# Patient Record
Sex: Female | Born: 2000 | Race: White | Hispanic: No | Marital: Single | Smoking: Never smoker
Health system: Southern US, Community
[De-identification: ages and names within clinical notes are randomized; demographics above are authoritative.]

## PROBLEM LIST (undated history)

## (undated) ENCOUNTER — Ambulatory Visit (HOSPITAL_COMMUNITY): Admission: EM | Payer: Medicaid Other | Source: Home / Self Care

## (undated) DIAGNOSIS — E039 Hypothyroidism, unspecified: Secondary | ICD-10-CM

---
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
## 2022-01-30 DIAGNOSIS — N926 Irregular menstruation, unspecified: Secondary | ICD-10-CM | POA: Diagnosis not present

## 2022-01-30 DIAGNOSIS — E039 Hypothyroidism, unspecified: Secondary | ICD-10-CM | POA: Diagnosis not present

## 2022-01-30 DIAGNOSIS — F901 Attention-deficit hyperactivity disorder, predominantly hyperactive type: Secondary | ICD-10-CM | POA: Diagnosis not present

## 2022-01-30 DIAGNOSIS — H6501 Acute serous otitis media, right ear: Secondary | ICD-10-CM | POA: Diagnosis not present

## 2022-02-09 ENCOUNTER — Encounter: Payer: Medicaid Other | Admitting: Obstetrics and Gynecology

## 2022-04-09 ENCOUNTER — Encounter: Payer: Medicaid Other | Admitting: Certified Nurse Midwife

## 2022-04-09 ENCOUNTER — Emergency Department (HOSPITAL_COMMUNITY)
Admission: EM | Admit: 2022-04-09 | Discharge: 2022-04-09 | Disposition: A | Discharge status: Home / Self Care | Payer: Medicaid Other

## 2022-04-09 DIAGNOSIS — U071 COVID-19: Secondary | ICD-10-CM | POA: Diagnosis not present

## 2022-04-09 DIAGNOSIS — Z01419 Encounter for gynecological examination (general) (routine) without abnormal findings: Secondary | ICD-10-CM

## 2022-04-09 DIAGNOSIS — Z3009 Encounter for other general counseling and advice on contraception: Secondary | ICD-10-CM

## 2022-04-09 DIAGNOSIS — R509 Fever, unspecified: Secondary | ICD-10-CM | POA: Diagnosis not present

## 2022-04-09 DIAGNOSIS — N939 Abnormal uterine and vaginal bleeding, unspecified: Secondary | ICD-10-CM

## 2022-04-09 NOTE — ED Notes (Signed)
Called multiple times for triage and unable to find 

## 2022-05-09 DIAGNOSIS — R42 Dizziness and giddiness: Secondary | ICD-10-CM | POA: Diagnosis not present

## 2022-05-09 DIAGNOSIS — G4489 Other headache syndrome: Secondary | ICD-10-CM | POA: Diagnosis not present

## 2022-05-09 DIAGNOSIS — H9203 Otalgia, bilateral: Secondary | ICD-10-CM | POA: Diagnosis not present

## 2022-05-09 DIAGNOSIS — H6993 Unspecified Eustachian tube disorder, bilateral: Secondary | ICD-10-CM | POA: Diagnosis not present

## 2022-06-18 ENCOUNTER — Other Ambulatory Visit (HOSPITAL_COMMUNITY)
Admission: RE | Admit: 2022-06-18 | Discharge: 2022-06-18 | Disposition: A | Discharge status: Home / Self Care | Payer: Medicaid Other | Source: Ambulatory Visit

## 2022-06-18 ENCOUNTER — Ambulatory Visit (INDEPENDENT_AMBULATORY_CARE_PROVIDER_SITE_OTHER): Payer: Medicaid Other | Admitting: Student

## 2022-06-18 ENCOUNTER — Encounter: Payer: Self-pay | Admitting: Student

## 2022-06-18 VITALS — BP 108/62 | HR 87 | Ht 64.0 in | Wt 212.0 lb

## 2022-06-18 DIAGNOSIS — Z30011 Encounter for initial prescription of contraceptive pills: Secondary | ICD-10-CM

## 2022-06-18 DIAGNOSIS — Z7689 Persons encountering health services in other specified circumstances: Secondary | ICD-10-CM

## 2022-06-18 DIAGNOSIS — Z113 Encounter for screening for infections with a predominantly sexual mode of transmission: Secondary | ICD-10-CM

## 2022-06-18 DIAGNOSIS — N920 Excessive and frequent menstruation with regular cycle: Secondary | ICD-10-CM | POA: Diagnosis not present

## 2022-06-18 DIAGNOSIS — N922 Excessive menstruation at puberty: Secondary | ICD-10-CM

## 2022-06-18 DIAGNOSIS — Z01419 Encounter for gynecological examination (general) (routine) without abnormal findings: Secondary | ICD-10-CM | POA: Diagnosis not present

## 2022-06-18 DIAGNOSIS — N926 Irregular menstruation, unspecified: Secondary | ICD-10-CM | POA: Diagnosis not present

## 2022-06-18 LAB — POCT URINE PREGNANCY: Preg Test, Ur: NEGATIVE

## 2022-06-18 MED ORDER — NORETHIN ACE-ETH ESTRAD-FE 1-20 MG-MCG PO TABS
1.0000 | ORAL_TABLET | Freq: Every day | ORAL | 4 refills | Status: AC
Start: 2022-06-18 — End: 2023-06-18

## 2022-06-18 NOTE — Progress Notes (Signed)
History:  [name] is a 21 y.o. [id number] who presents to clinic today for concerns with menstrual cycles.   Patient reports heavy and uncomfortable periods since onset at 21 years old. Periods are irregular. Patient states they generally come every 6 weeks, however, sometimes it will come sooner at 2 weeks. Periods are reported as heavy for 5 days and then light spotting for the last 3 days. Patient is using regular pads or super tampons that are saturating every 2 hours. During her periods she experiences painful cramping and loose stools. Also has noted that she experiences episodes of sexual intercourse that cause her to feel faint and unwell. Shares this does not happen every time. Patient is currently taking synthroid for hypothyroidism and has not been able to obtain follow-up blood work in 1.5 years (has been waiting for endocrinologist to become available). Patient also discloses starting a COC at this same time, but discontinuing after 2 months due to no improvement in symptoms. Patient desires STI testing today for routine surveillance.   The following portions of the patient's history were reviewed and updated as appropriate: allergies, current medications, family history, past medical history, social history, past surgical history and problem list.  Review of Systems:  Review of Systems  All other systems reviewed and are negative.     Objective:  Physical Exam BP 108/62   Pulse 87   Ht 5\' 4"  (1.626 m)   Wt 212 lb (96.2 kg)   LMP 06/15/2022   BMI 36.39 kg/m  Physical Exam Vitals and nursing note reviewed.  Constitutional:      Appearance: Normal appearance.  HENT:     Head: Normocephalic.  Cardiovascular:     Rate and Rhythm: Normal rate.  Pulmonary:     Effort: Pulmonary effort is normal.  Abdominal:     Palpations: Abdomen is soft.     Tenderness: There is no abdominal tenderness.  Skin:    General: Skin is warm and dry.  Neurological:     Mental Status:  She is alert and oriented to person, place, and time.    Labs and Imaging No results found for this or any previous visit (from the past 24 hour(s)).  No results found.  Health Maintenance Due  Topic Date Due   COVID-19 Vaccine (1) Never done   HPV VACCINES (1 - 2-dose series) Never done   CHLAMYDIA SCREENING  Never done   HIV Screening  Never done   Hepatitis C Screening  Never done   INFLUENZA VACCINE  Never done   DTaP/Tdap/Td (1 - Tdap) Never done    Labs, imaging and previous visits in Epic and Care Everywhere reviewed  Assessment & Plan:  1. Routine screening for STI (sexually transmitted infection) - No current s/s - Cervicovaginal ancillary only - Hepatitis B surface antigen - Hepatitis C antibody - HIV Antibody (routine testing w rflx) - RPR  2. Encounter to establish care - Addressed patient desire to see Gynecologist for women's health concerns  3. Excessive menstruation at puberty 4. Irregular periods/menstrual cycles - POCT Pregnancy, Urine - Thyroid Panel With TSH - CBC - Prolactin - HgB A1c - FSH - Testosterone, Free, Total, SHBG  5. Encounter for BCP (birth control pills) initial prescription - Negative pregnancy test today. - Within 7 days of previous period, encouraged to Quick start. Discussed the benefits of OCPs in regulating cycles. Reviewed other forms of combined  hormonal contraceptive medication including patch and ring. Risks and benefits reviewed.  Questions were answered.  Information was given to patient to review.    Johnston Ebbs, NP 06/18/2022 2:00 PM

## 2022-06-18 NOTE — Progress Notes (Signed)
NGYN presents for menorrhagia, dysmenorrhea, and all STD testing.  She believes BCP made menstrual cramping worst.

## 2022-06-19 LAB — CERVICOVAGINAL ANCILLARY ONLY
Chlamydia: NEGATIVE
Comment: NEGATIVE
Comment: NEGATIVE
Comment: NORMAL
Neisseria Gonorrhea: NEGATIVE
Trichomonas: NEGATIVE

## 2022-06-19 LAB — HEPATITIS B SURFACE ANTIGEN: Hepatitis B Surface Ag: NEGATIVE

## 2022-06-19 LAB — RPR: RPR Ser Ql: NONREACTIVE

## 2022-06-19 LAB — HIV ANTIBODY (ROUTINE TESTING W REFLEX): HIV Screen 4th Generation wRfx: NONREACTIVE

## 2022-06-19 LAB — HEPATITIS C ANTIBODY: Hep C Virus Ab: NONREACTIVE

## 2022-06-22 LAB — TESTOSTERONE, FREE, TOTAL, SHBG
Sex Hormone Binding: 24 nmol/L — ABNORMAL LOW (ref 24.6–122.0)
Testosterone, Free: 6.3 pg/mL
Testosterone: 80 ng/dL — ABNORMAL HIGH (ref 13–71)

## 2022-06-22 LAB — CBC
Hematocrit: 40.6 % (ref 34.0–46.6)
Hemoglobin: 14 g/dL (ref 11.1–15.9)
MCH: 31.6 pg (ref 26.6–33.0)
MCHC: 34.5 g/dL (ref 31.5–35.7)
MCV: 92 fL (ref 79–97)
Platelets: 311 10*3/uL (ref 150–450)
RBC: 4.43 x10E6/uL (ref 3.77–5.28)
RDW: 12.4 % (ref 11.7–15.4)
WBC: 6.3 10*3/uL (ref 3.4–10.8)

## 2022-06-22 LAB — FOLLICLE STIMULATING HORMONE: FSH: 5.5 m[IU]/mL

## 2022-06-22 LAB — THYROID PANEL WITH TSH
Free Thyroxine Index: 1.5 (ref 1.2–4.9)
T3 Uptake Ratio: 26 % (ref 24–39)
T4, Total: 5.9 ug/dL (ref 4.5–12.0)
TSH: 6.44 u[IU]/mL — ABNORMAL HIGH (ref 0.450–4.500)

## 2022-06-22 LAB — HEMOGLOBIN A1C
Est. average glucose Bld gHb Est-mCnc: 103 mg/dL
Hgb A1c MFr Bld: 5.2 % (ref 4.8–5.6)

## 2022-06-22 LAB — PROLACTIN: Prolactin: 6.3 ng/mL (ref 4.8–23.3)

## 2022-06-28 ENCOUNTER — Other Ambulatory Visit: Payer: Self-pay | Admitting: Student

## 2022-06-28 DIAGNOSIS — E039 Hypothyroidism, unspecified: Secondary | ICD-10-CM

## 2022-06-28 MED ORDER — LEVOTHYROXINE SODIUM 125 MCG PO TABS
125.0000 ug | ORAL_TABLET | Freq: Every day | ORAL | 0 refills | Status: DC
  Filled 2022-06-28: qty 60, 60d supply, fill #0

## 2022-06-29 ENCOUNTER — Other Ambulatory Visit (HOSPITAL_COMMUNITY): Payer: Self-pay

## 2022-07-17 DIAGNOSIS — R059 Cough, unspecified: Secondary | ICD-10-CM | POA: Diagnosis not present

## 2022-07-17 DIAGNOSIS — Z20822 Contact with and (suspected) exposure to covid-19: Secondary | ICD-10-CM | POA: Diagnosis not present

## 2022-07-17 DIAGNOSIS — R079 Chest pain, unspecified: Secondary | ICD-10-CM | POA: Diagnosis not present

## 2022-08-24 ENCOUNTER — Encounter: Payer: Self-pay | Admitting: Internal Medicine

## 2022-08-24 ENCOUNTER — Ambulatory Visit (INDEPENDENT_AMBULATORY_CARE_PROVIDER_SITE_OTHER): Payer: Medicaid Other | Admitting: Internal Medicine

## 2022-08-24 VITALS — BP 120/80 | HR 76 | Ht 64.0 in | Wt 209.0 lb

## 2022-08-24 DIAGNOSIS — E039 Hypothyroidism, unspecified: Secondary | ICD-10-CM | POA: Diagnosis not present

## 2022-08-24 DIAGNOSIS — R635 Abnormal weight gain: Secondary | ICD-10-CM

## 2022-08-24 DIAGNOSIS — R7989 Other specified abnormal findings of blood chemistry: Secondary | ICD-10-CM | POA: Diagnosis not present

## 2022-08-24 DIAGNOSIS — R5383 Other fatigue: Secondary | ICD-10-CM | POA: Diagnosis not present

## 2022-08-24 DIAGNOSIS — E559 Vitamin D deficiency, unspecified: Secondary | ICD-10-CM

## 2022-08-24 LAB — VITAMIN D 25 HYDROXY (VIT D DEFICIENCY, FRACTURES): VITD: 16.43 ng/mL — ABNORMAL LOW (ref 30.00–100.00)

## 2022-08-24 LAB — BASIC METABOLIC PANEL
BUN: 10 mg/dL (ref 6–23)
CO2: 23 mEq/L (ref 19–32)
Calcium: 9.9 mg/dL (ref 8.4–10.5)
Chloride: 103 mEq/L (ref 96–112)
Creatinine, Ser: 0.82 mg/dL (ref 0.40–1.20)
GFR: 103.81 mL/min (ref >60.00)
Glucose, Bld: 94 mg/dL (ref 70–99)
Potassium: 4.1 mEq/L (ref 3.5–5.1)
Sodium: 136 mEq/L (ref 135–145)

## 2022-08-24 LAB — TSH: TSH: 12.09 u[IU]/mL — ABNORMAL HIGH (ref 0.40–5.00)

## 2022-08-24 LAB — VITAMIN B12: Vitamin B-12: 223 pg/mL (ref 211–911)

## 2022-08-24 NOTE — Patient Instructions (Signed)

## 2022-08-24 NOTE — Progress Notes (Signed)
Name: Michele Grant  MRN/ DOB: [id number], August 15, 2000    Age/ Sex: 22 y.o., female    PCP: Aggie Hacker, MD   Reason for Endocrinology Evaluation: Hypothyroidism      Date of Initial Endocrinology Evaluation: 08/24/2022     HPI: [name] is a 22 y.o. female with a past medical history of Hypothyroidism. The patient presented for initial endocrinology clinic visit on 08/24/2022 for consultative assistance with her Hypothyroidism.    She is accompanied by her boyfriend today Pt has been diagnosed with hypothyroidism 2023, she was started on Levothyroxine at the time  She was having weight gain as well as hair loss and fatigue , she has not noted any improvement of her symptoms despite being on levothyroxine  She does endorse occasional local neck swelling LMP 08/23/2022- monthly ` Follows with Gyn   Has noted hirsutism around the chin area  Denies acne   She is not on a certain diet , she does not exercise per se but has an active life She has also been noted with elevated testosterone 06/2022 at 80 ng/dL  She had a normal pelvic ultrasound 02/2021 Tried OCP's but caused amenorrhea   Maternal GM with thyroid disease  HISTORY:  Past Medical History: No past medical history on file. Past Surgical History: No past surgical history on file.  Social History:  reports that she has never smoked. She has been exposed to tobacco smoke. She has never used smokeless tobacco. She reports that she does not drink alcohol and does not use drugs. Family History: family history is not on file.   HOME MEDICATIONS: Allergies as of 08/24/2022   No Known Allergies      Medication List        Accurate as of August 24, 2022  1:12 PM. If you have any questions, ask your nurse or doctor.          STOP taking these medications    meclizine 25 MG tablet Commonly known as: ANTIVERT Stopped by: Scarlette Shorts, MD   ondansetron 4 MG tablet Commonly known as: Zofran Stopped  by: Scarlette Shorts, MD       TAKE these medications    levothyroxine 125 MCG tablet Commonly known as: Synthroid Take 1 tablet (125 mcg total) by mouth daily before breakfast.   levothyroxine 112 MCG tablet Commonly known as: SYNTHROID Take 112 mcg by mouth daily.   norethindrone-ethinyl estradiol-FE 1-20 MG-MCG tablet Commonly known as: LOESTRIN FE Take 1 tablet by mouth daily.   Vyvanse 30 MG capsule Generic drug: lisdexamfetamine Take 30 mg by mouth daily.          REVIEW OF SYSTEMS: A comprehensive ROS was conducted with the patient and is negative except as per HPI     OBJECTIVE:  VS: BP 120/80 (BP Location: Left Arm, Patient Position: Sitting, Cuff Size: Large)   Pulse 76   Ht 5\' 4"  (1.626 m)   Wt 209 lb (94.8 kg)   SpO2 99%   BMI 35.87 kg/m    Wt Readings from Last 3 Encounters:  08/24/22 209 lb (94.8 kg) (98 %, Z= 2.07)*  06/18/22 212 lb (96.2 kg) (98 %, Z= 2.11)*  08/06/21 200 lb (90.7 kg) (98 %, Z= 1.97)*   * Growth percentiles are based on CDC (Girls, 2-20 Years) data.     EXAM: General: Pt appears well and is in NAD  Eyes: External eye exam normal without stare, lid lag or exophthalmos.  EOM intact.  Neck: General: Supple without adenopathy. Thyroid: Thyroid size normal.  No goiter or nodules appreciated.  Lungs: Clear with good BS bilat with no rales, rhonchi, or wheezes  Heart: Auscultation: RRR.  Abdomen: soft, nontender, multiple abdominal wall striae  Extremities:  BL LE: No pretibial edema normal ROM and strength.  Mental Status: Judgment, insight: Intact Orientation: Oriented to time, place, and person Mood and affect: No depression, anxiety, or agitation     DATA REVIEWED:     Latest Reference Range & Units 08/24/22 13:52  Sodium 135 - 145 mEq/L 136  Potassium 3.5 - 5.1 mEq/L 4.1  Chloride 96 - 112 mEq/L 103  CO2 19 - 32 mEq/L 23  Glucose 70 - 99 mg/dL 94  BUN 6 - 23 mg/dL 10  Creatinine 1.61 - 0.96 mg/dL 0.45   Calcium 8.4 - 40.9 mg/dL 9.9  GFR >81.19 mL/min 103.81    Latest Reference Range & Units 08/24/22 13:52  VITD 30.00 - 100.00 ng/mL 16.43 (L)  Vitamin B12 211 - 911 pg/mL 223    Latest Reference Range & Units 08/24/22 13:52  TSH 0.40 - 5.00 uIU/mL 12.09 (H)  (H): Data is abnormally high  ASSESSMENT/PLAN/RECOMMENDATIONS:   Hypothyroidism:  -Patient with symptoms of weight gain, and fatigue -- Pt educated extensively on the correct way to take levothyroxine (first thing in the morning with water, 30 minutes before eating or taking other medications). - Pt encouraged to double dose the following day if she were to miss a dose given long half-life of levothyroxine.   Medications : Levothyroxine 112 mcg daily   2.  Elevated testosterone:  -Patient does NOT meet criteria for PCOS -She does have minimal hirsutism around the chin area, she tried OCPs through her GYN but did not like the way they made her feel -We discussed lifestyle changes and weight loss as a way to improve testosterone level   3.  Weight gain:  -Will screen for Cushing syndrome with 24-hour urinary cortisol -Counseled patient regarding lifestyle changes, avoiding snacks, avoiding sugar sweetened Beverages -Encourage regular exercise as tolerated  4.  Vitamin D deficiency/low vitamin B12:  -Will start ergocalciferol 50,000 IU weekly -Patient will be advised to start vitamin B12 1000 mcg daily    Follow-up in 6 months  Signed electronically by: Lyndle Herrlich, MD  [facility]  [facility] [address] [place] [place], [place] [address] Phone: [phone] FAX: [phone]   CC: Aggie Hacker, MD [address] [place] [address] Phone: [phone] Fax: [phone]   Return to Endocrinology clinic as below: No future appointments.

## 2022-08-25 DIAGNOSIS — E559 Vitamin D deficiency, unspecified: Secondary | ICD-10-CM | POA: Insufficient documentation

## 2022-08-25 DIAGNOSIS — R635 Abnormal weight gain: Secondary | ICD-10-CM | POA: Insufficient documentation

## 2022-08-25 DIAGNOSIS — R7989 Other specified abnormal findings of blood chemistry: Secondary | ICD-10-CM | POA: Insufficient documentation

## 2022-08-25 DIAGNOSIS — R5383 Other fatigue: Secondary | ICD-10-CM | POA: Insufficient documentation

## 2022-08-25 DIAGNOSIS — E039 Hypothyroidism, unspecified: Secondary | ICD-10-CM | POA: Insufficient documentation

## 2022-08-25 MED ORDER — VITAMIN D (ERGOCALCIFEROL) 1.25 MG (50000 UNIT) PO CAPS: 50000.0000 [IU] | ORAL_CAPSULE | ORAL | 2 refills | Status: AC

## 2022-08-25 MED ORDER — LEVOTHYROXINE SODIUM 125 MCG PO TABS: 125.0000 ug | ORAL_TABLET | Freq: Every day | ORAL | 2 refills | Status: DC

## 2022-08-26 LAB — THYROID PEROXIDASE ANTIBODY: Thyroperoxidase Ab SerPl-aCnc: 410 IU/mL — ABNORMAL HIGH (ref <9)

## 2022-09-14 DIAGNOSIS — F4310 Post-traumatic stress disorder, unspecified: Secondary | ICD-10-CM | POA: Diagnosis not present

## 2022-10-06 DIAGNOSIS — H5213 Myopia, bilateral: Secondary | ICD-10-CM | POA: Diagnosis not present

## 2022-10-20 ENCOUNTER — Other Ambulatory Visit (INDEPENDENT_AMBULATORY_CARE_PROVIDER_SITE_OTHER): Payer: Medicaid Other

## 2022-10-20 DIAGNOSIS — E039 Hypothyroidism, unspecified: Secondary | ICD-10-CM | POA: Diagnosis not present

## 2022-10-20 LAB — TSH: TSH: 3.17 u[IU]/mL (ref 0.40–5.00)

## 2022-10-22 DIAGNOSIS — H6993 Unspecified Eustachian tube disorder, bilateral: Secondary | ICD-10-CM | POA: Diagnosis not present

## 2022-10-22 DIAGNOSIS — H938X3 Other specified disorders of ear, bilateral: Secondary | ICD-10-CM | POA: Diagnosis not present

## 2022-11-23 DIAGNOSIS — H5213 Myopia, bilateral: Secondary | ICD-10-CM | POA: Diagnosis not present

## 2022-11-23 DIAGNOSIS — H52223 Regular astigmatism, bilateral: Secondary | ICD-10-CM | POA: Diagnosis not present

## 2022-11-27 ENCOUNTER — Ambulatory Visit: Payer: Medicaid Other | Admitting: Student

## 2023-01-27 ENCOUNTER — Emergency Department (HOSPITAL_COMMUNITY)
Admission: EM | Admit: 2023-01-27 | Discharge: 2023-01-27 | Disposition: A | Discharge status: Home / Self Care | Payer: Medicaid Other | Attending: Emergency Medicine | Admitting: Emergency Medicine

## 2023-01-27 ENCOUNTER — Encounter (HOSPITAL_COMMUNITY): Payer: Self-pay | Admitting: Emergency Medicine

## 2023-01-27 ENCOUNTER — Other Ambulatory Visit: Payer: Self-pay

## 2023-01-27 DIAGNOSIS — R519 Headache, unspecified: Secondary | ICD-10-CM | POA: Insufficient documentation

## 2023-01-27 HISTORY — DX: Hypothyroidism, unspecified: E03.9

## 2023-01-27 MED ORDER — KETOROLAC TROMETHAMINE 60 MG/2ML IM SOLN
30.0000 mg | Freq: Once | INTRAMUSCULAR | Status: AC
  Administered 2023-01-27: 30 mg via INTRAMUSCULAR
  Filled 2023-01-27: qty 2

## 2023-01-27 NOTE — ED Triage Notes (Signed)
Pt reports waking up with headache to back of head. No relief with tylenol. Hx of migraines

## 2023-01-27 NOTE — ED Provider Notes (Signed)
[place] EMERGENCY DEPARTMENT AT [facility] Provider Note   CSN: [id number] Arrival date & time: 01/27/23  4098     History  Chief Complaint  Patient presents with   Headache    Michele Grant is a 22 y.o. female.  22 year old female with history of migraines presents with pain to her but similar to her prior migraines.  Denies any vomiting has some nausea.  No visual changes.  No peripheral weakness.  Took Tylenol with some relief.  Denies any fever or neck pain       Home Medications Prior to Admission medications   Medication Sig Start Date End Date Taking? Authorizing Provider  levothyroxine (SYNTHROID) 125 MCG tablet Take 1 tablet (125 mcg total) by mouth daily before breakfast. 08/25/22   Shamleffer, Konrad Dolores, MD  norethindrone-ethinyl estradiol-FE (LOESTRIN FE) 1-20 MG-MCG tablet Take 1 tablet by mouth daily. Patient not taking: Reported on 08/24/2022 06/18/22 06/18/23  Corlis Hove, NP  Vitamin D, Ergocalciferol, (DRISDOL) 1.25 MG (50000 UNIT) CAPS capsule Take 1 capsule (50,000 Units total) by mouth every 7 (seven) days. 08/25/22   Shamleffer, Konrad Dolores, MD  VYVANSE 30 MG capsule Take 30 mg by mouth daily. 03/11/22   [provider]      Allergies    Patient has no known allergies.    Review of Systems   Review of Systems  All other systems reviewed and are negative.   Physical Exam Updated Vital Signs BP (!) 144/81 (BP Location: Left Arm)   Pulse 83   Temp 98.7 F (37.1 C) (Oral)   Resp 18   Ht 1.626 m (5\' 4" )   Wt 95.3 kg   SpO2 99%   BMI 36.05 kg/m  Physical Exam Vitals and nursing note reviewed.  Constitutional:      General: She is not in acute distress.    Appearance: Normal appearance. She is well-developed. She is not toxic-appearing.  HENT:     Head: Normocephalic and atraumatic.  Eyes:     General: Lids are normal.     Conjunctiva/sclera: Conjunctivae normal.     Pupils: Pupils are equal, round, and  reactive to light.  Neck:     Thyroid: No thyroid mass.     Trachea: No tracheal deviation.  Cardiovascular:     Rate and Rhythm: Normal rate and regular rhythm.     Heart sounds: Normal heart sounds. No murmur heard.    No gallop.  Pulmonary:     Effort: Pulmonary effort is normal. No respiratory distress.     Breath sounds: Normal breath sounds. No stridor. No decreased breath sounds, wheezing, rhonchi or rales.  Abdominal:     General: There is no distension.     Palpations: Abdomen is soft.     Tenderness: There is no abdominal tenderness. There is no rebound.  Musculoskeletal:        General: No tenderness. Normal range of motion.     Cervical back: Normal range of motion and neck supple.  Skin:    General: Skin is warm and dry.     Findings: No abrasion or rash.  Neurological:     General: No focal deficit present.     Mental Status: She is alert and oriented to person, place, and time. Mental status is at baseline.     GCS: GCS eye subscore is 4. GCS verbal subscore is 5. GCS motor subscore is 6.     Cranial Nerves: Cranial nerves are intact. No cranial  nerve deficit.     Sensory: No sensory deficit.     Motor: Motor function is intact.     Gait: Gait is intact.  Psychiatric:        Attention and Perception: Attention normal.        Speech: Speech normal.        Behavior: Behavior normal.     ED Results / Procedures / Treatments   Labs (all labs ordered are listed, but only abnormal results are displayed) Labs Reviewed - No data to display  EKG None  Radiology No results found.  Procedures Procedures    Medications Ordered in ED Medications  ketorolac (TORADOL) injection 30 mg (has no administration in time range)    ED Course/ Medical Decision Making/ A&P                             Medical Decision Making Risk Prescription drug management.   Patient with typical migraine.  Patient given Toradol.  No indication intracranial imaging at this time  although this was discussed with the patient.  Will follow-up with her doctor as needed        Final Clinical Impression(s) / ED Diagnoses Final diagnoses:  None    Rx / DC Orders ED Discharge Orders     None         Lorre Nick, MD 01/27/23 [id number]

## 2023-02-23 ENCOUNTER — Ambulatory Visit: Payer: Medicaid Other | Admitting: Internal Medicine

## 2023-02-23 NOTE — Progress Notes (Deleted)
Name: Michele Grant  MRN/ DOB: [id number], July 04, 2001    Age/ Sex: 22 y.o., female    PCP: Aggie Hacker, MD   Reason for Endocrinology Evaluation: Hypothyroidism      Date of Initial Endocrinology Evaluation: 08/24/2022    HPI: [name] is a 22 y.o. female with a past medical history of Hypothyroidism. The patient presented for initial endocrinology clinic visit on 08/24/2022  for consultative assistance with her Hypothyroidism.    Pt has been diagnosed with hypothyroidism 2023, she was started on Levothyroxine at the time  She was having weight gain as well as hair loss with  fatigue , she has not noted any improvement of her symptoms despite being on levothyroxine   On initial visit to our clinic she has been noted with elevated TPO antibodies at 410 IU/mL  Maternal GM with thyroid disease   She has also been noted with elevated testosterone 06/2022 at 80 ng/dL  She had a normal pelvic ultrasound 02/2021 Tried OCP's but caused amenorrhea   SUBJECTIVE:    Today (02/23/23):  [name] is here for a follow-up on Hashimoto's thyroiditis.    LMP 08/23/2022- monthly ` Follows with Gyn   Has noted hirsutism around the chin area  Denies acne  She has also been noted with elevated testosterone 06/2022 at 80 ng/dL     Ergocalciferol 28,315 IU weekly Vitamin B12 1000 mcg daily   HISTORY:  Past Medical History:  Past Medical History:  Diagnosis Date   Hypothyroidism    Past Surgical History: No past surgical history on file.  Social History:  reports that she has never smoked. She has been exposed to tobacco smoke. She has never used smokeless tobacco. She reports that she does not drink alcohol and does not use drugs. Family History: family history is not on file.   HOME MEDICATIONS: Allergies as of 02/23/2023   No Known Allergies      Medication List        Accurate as of February 23, 2023  6:54 AM. If you have any questions, ask your nurse or doctor.           levothyroxine 125 MCG tablet Commonly known as: Synthroid Take 1 tablet (125 mcg total) by mouth daily before breakfast.   norethindrone-ethinyl estradiol-FE 1-20 MG-MCG tablet Commonly known as: LOESTRIN FE Take 1 tablet by mouth daily.   Vitamin D (Ergocalciferol) 1.25 MG (50000 UNIT) Caps capsule Commonly known as: DRISDOL Take 1 capsule (50,000 Units total) by mouth every 7 (seven) days.   Vyvanse 30 MG capsule Generic drug: lisdexamfetamine Take 30 mg by mouth daily.          REVIEW OF SYSTEMS: A comprehensive ROS was conducted with the patient and is negative except as per HPI     OBJECTIVE:  VS: There were no vitals taken for this visit.   Wt Readings from Last 3 Encounters:  01/27/23 210 lb (95.3 kg) (98%, Z= 2.08)*  08/24/22 209 lb (94.8 kg) (98%, Z= 2.07)*  06/18/22 212 lb (96.2 kg) (98%, Z= 2.11)*   * Growth percentiles are based on CDC (Girls, 2-20 Years) data.     EXAM: General: Pt appears well and is in NAD  Eyes: External eye exam normal without stare, lid lag or exophthalmos.  EOM intact.  Neck: General: Supple without adenopathy. Thyroid: Thyroid size normal.  No goiter or nodules appreciated.  Lungs: Clear with good BS bilat with no rales, rhonchi, or wheezes  Heart: Auscultation: RRR.  Abdomen: soft, nontender, multiple abdominal wall striae  Extremities:  BL LE: No pretibial edema normal ROM and strength.  Mental Status: Judgment, insight: Intact Orientation: Oriented to time, place, and person Mood and affect: No depression, anxiety, or agitation     DATA REVIEWED:     Latest Reference Range & Units 08/24/22 13:52  Sodium 135 - 145 mEq/L 136  Potassium 3.5 - 5.1 mEq/L 4.1  Chloride 96 - 112 mEq/L 103  CO2 19 - 32 mEq/L 23  Glucose 70 - 99 mg/dL 94  BUN 6 - 23 mg/dL 10  Creatinine 0.27 - 2.53 mg/dL 6.64  Calcium 8.4 - 40.3 mg/dL 9.9  GFR >47.42 mL/min 103.81    Latest Reference Range & Units 08/24/22 13:52  VITD  30.00 - 100.00 ng/mL 16.43 (L)  Vitamin B12 211 - 911 pg/mL 223    Latest Reference Range & Units 08/24/22 13:52  TSH 0.40 - 5.00 uIU/mL 12.09 (H)  (H): Data is abnormally high  ASSESSMENT/PLAN/RECOMMENDATIONS:   Hashimoto's thyroiditis  -Patient with symptoms of weight gain, and fatigue -- Pt educated extensively on the correct way to take levothyroxine (first thing in the morning with water, 30 minutes before eating or taking other medications). - Pt encouraged to double dose the following day if she were to miss a dose given long half-life of levothyroxine.   Medications : Levothyroxine 112 mcg daily    2.  Vitamin D deficiency/low vitamin B12:  -Will start ergocalciferol 50,000 IU weekly -Patient will be advised to start vitamin B12 1000 mcg daily    Follow-up in 6 months  Signed electronically by: Lyndle Herrlich, MD  [facility] Endocrinology  [facility] [address] [place] [place], [place] [address] Phone: [phone] FAX: [phone]   CC: Aggie Hacker, MD [address] [place] [address] Phone: [phone] Fax: [phone]   Return to Endocrinology clinic as below: Future Appointments  Date Time Provider Department Center  02/23/2023 12:10 PM , Konrad Dolores, MD [facility] None

## 2023-03-07 IMAGING — US US ABDOMEN COMPLETE
1 series · 14 of 25 positions shown · non-contrast
Comparison: None

CLINICAL DATA: Lower abdominal and pelvic pain, weight gain

EXAM:
ABDOMEN ULTRASOUND COMPLETE

[Series 1: us abdomen complete · 14 of 90 slices shown]
[im 1/90]
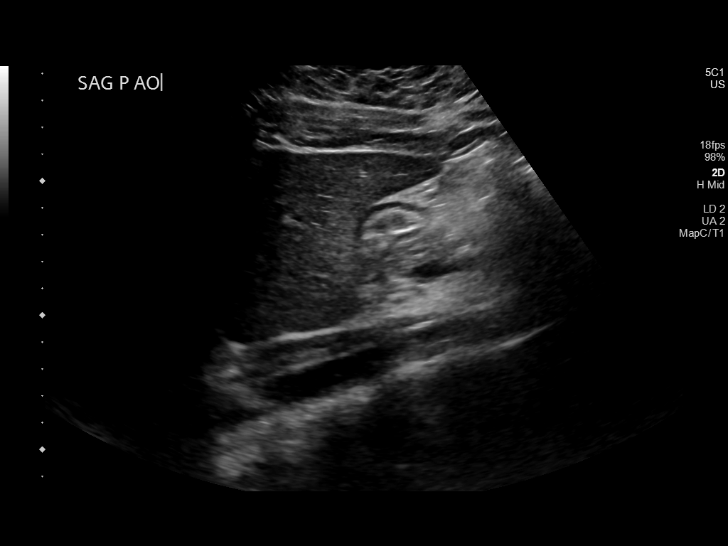
[im 8/90]
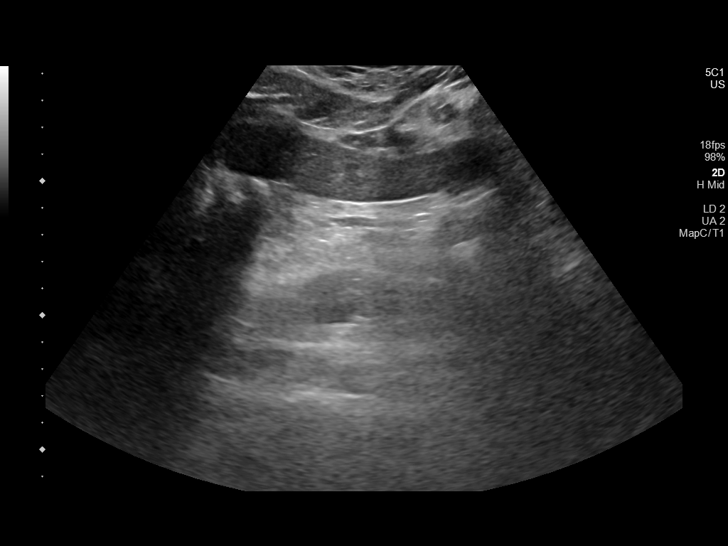
[im 15/90]
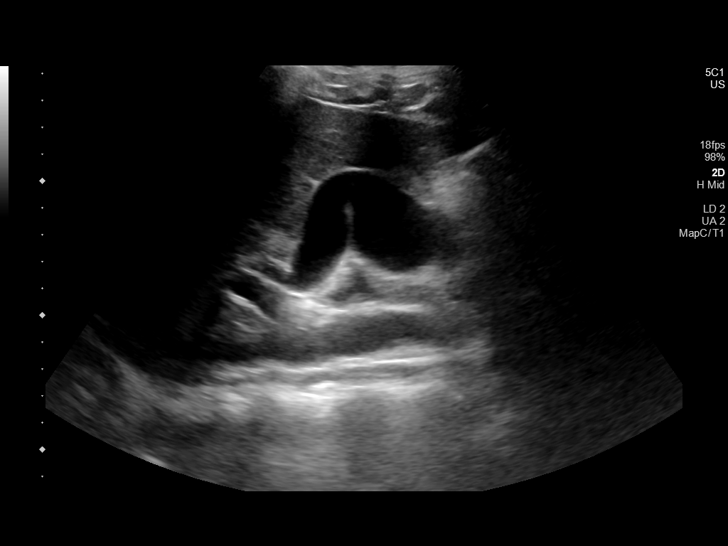
[im 23/90]
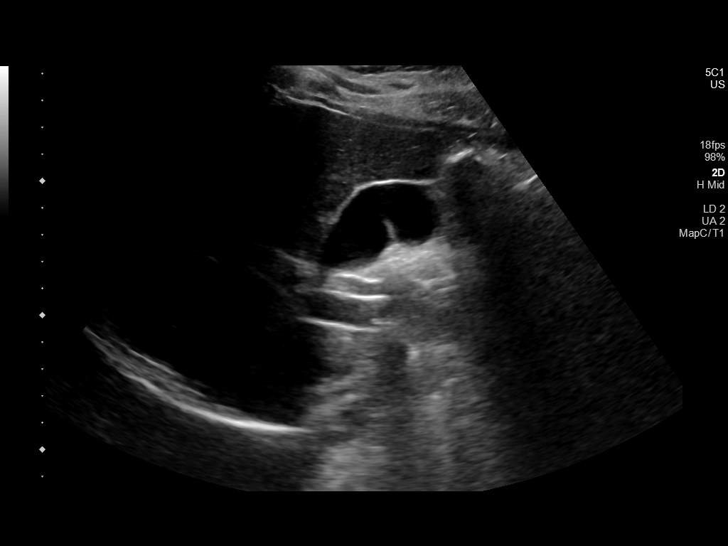
[im 30/90]
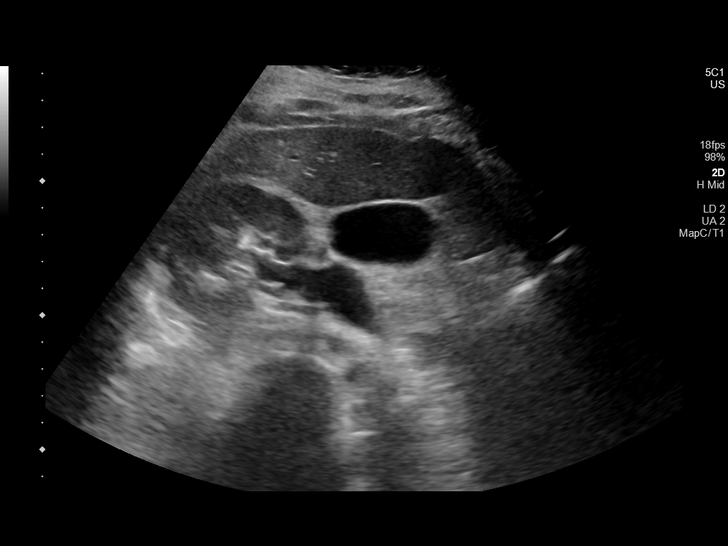
[im 34/90]
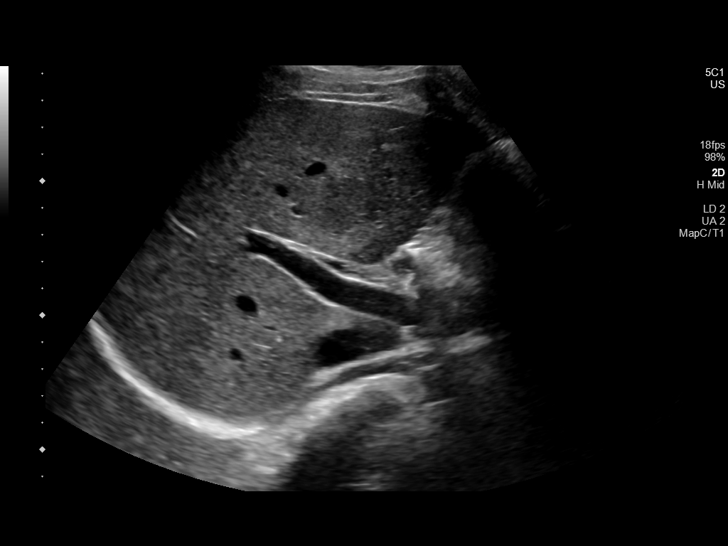
[im 41/90]
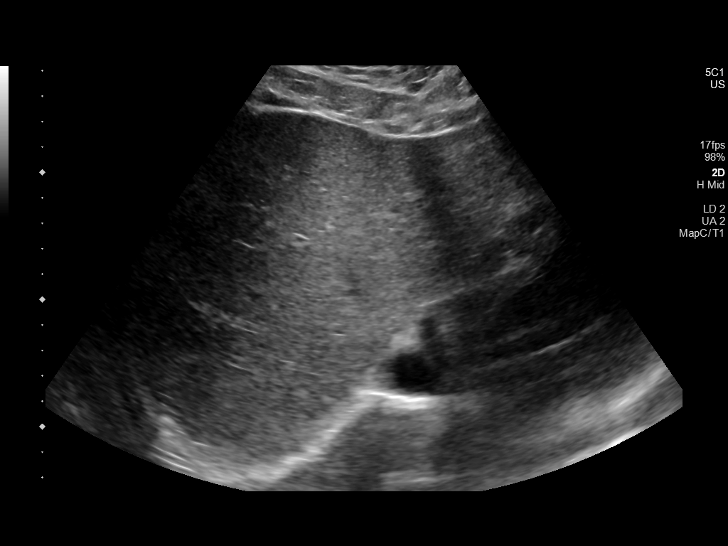
[im 49/90]
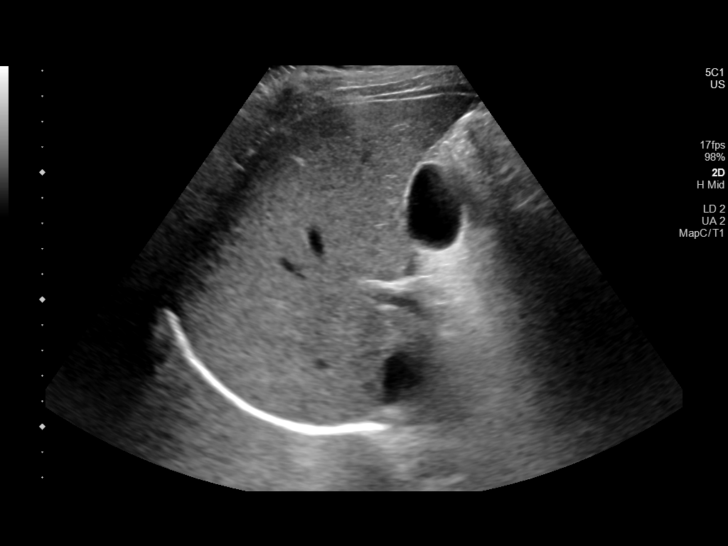
[im 56/90]
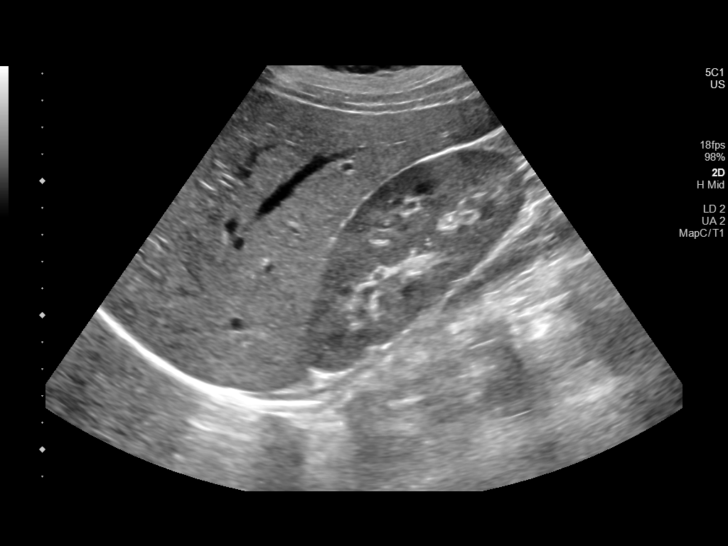
[im 60/90]
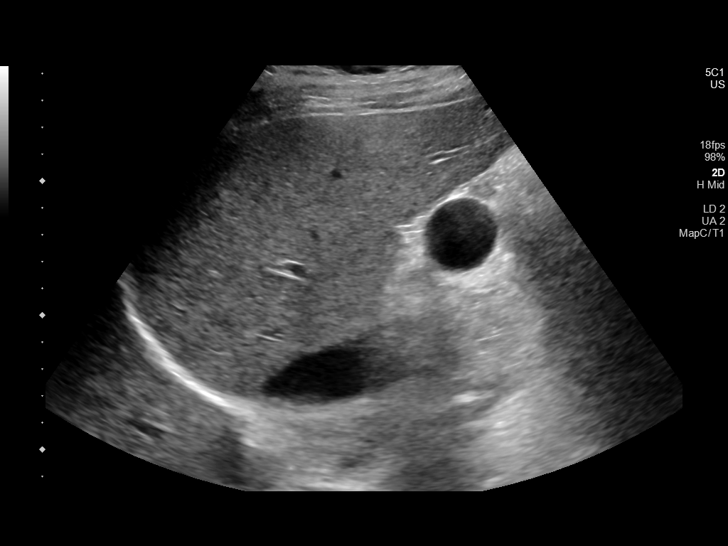
[im 67/90]
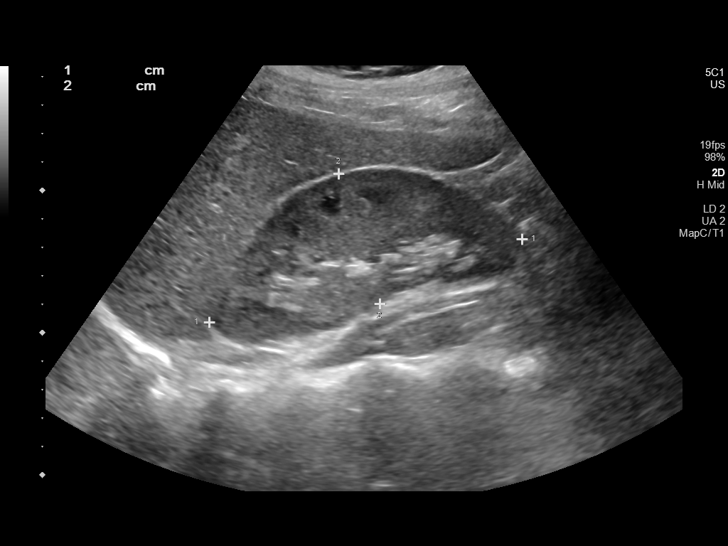
[im 75/90]
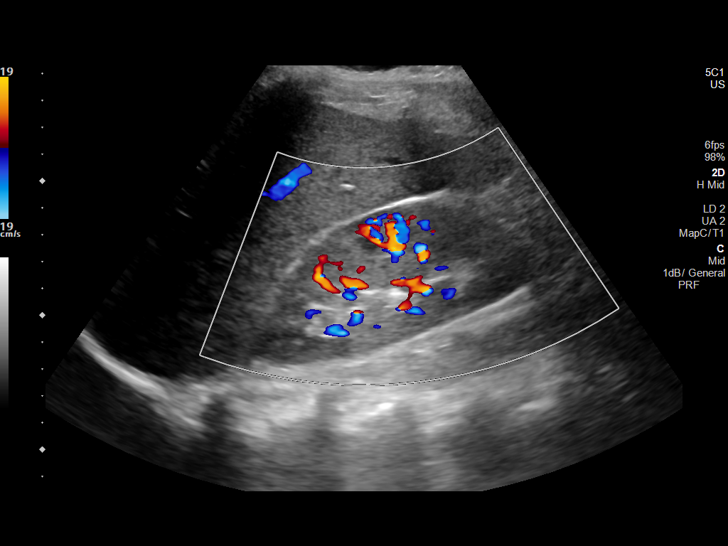
[im 82/90]
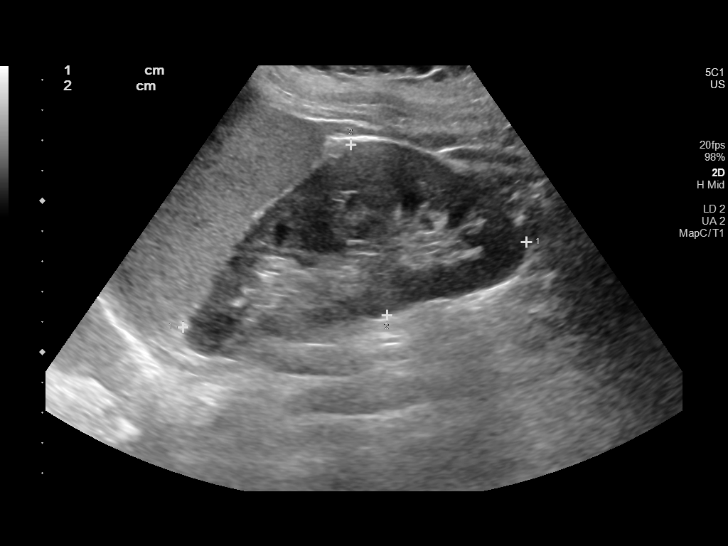
[im 90/90]
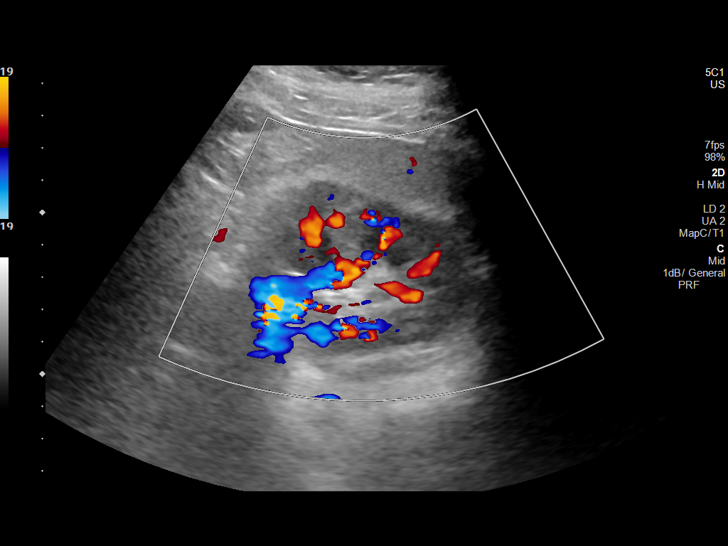

[14 of 25 positions shown; findings below may reference images not displayed]

FINDINGS: Gallbladder: Normally distended without stones or wall thickening.
No pericholecystic fluid or sonographic Murphy sign.

Common bile duct: Diameter: 5 mm, normal

Liver: Normal echogenicity without mass or nodularity. Portal vein
is patent on color Doppler imaging with normal direction of blood
flow towards the liver.

IVC: Normal appearance

Pancreas: Normal appearance

Spleen: Normal appearance, 9.6 cm length

Right Kidney: Length: 11.4 cm. Normal morphology without mass or
hydronephrosis.

Left Kidney: Length: 11.7 cm. Normal morphology without mass or
hydronephrosis.

Abdominal aorta: Normal caliber

Other findings: No free fluid
IMPRESSION: Normal exam.

## 2023-05-12 ENCOUNTER — Encounter (HOSPITAL_COMMUNITY): Payer: Self-pay | Admitting: Emergency Medicine

## 2023-05-12 ENCOUNTER — Other Ambulatory Visit: Payer: Self-pay

## 2023-05-12 ENCOUNTER — Emergency Department (HOSPITAL_COMMUNITY)
Admission: EM | Admit: 2023-05-12 | Discharge: 2023-05-12 | Disposition: A | Discharge status: Home / Self Care | Payer: Medicaid Other | Attending: Emergency Medicine | Admitting: Emergency Medicine

## 2023-05-12 DIAGNOSIS — O26891 Other specified pregnancy related conditions, first trimester: Secondary | ICD-10-CM | POA: Diagnosis present

## 2023-05-12 DIAGNOSIS — Z7189 Other specified counseling: Secondary | ICD-10-CM | POA: Insufficient documentation

## 2023-05-12 DIAGNOSIS — Z3201 Encounter for pregnancy test, result positive: Secondary | ICD-10-CM | POA: Diagnosis not present

## 2023-05-12 DIAGNOSIS — Z3A01 Less than 8 weeks gestation of pregnancy: Secondary | ICD-10-CM | POA: Diagnosis not present

## 2023-05-12 DIAGNOSIS — Z79899 Other long term (current) drug therapy: Secondary | ICD-10-CM | POA: Diagnosis not present

## 2023-05-12 DIAGNOSIS — E039 Hypothyroidism, unspecified: Secondary | ICD-10-CM | POA: Diagnosis not present

## 2023-05-12 LAB — HCG, QUANTITATIVE, PREGNANCY: hCG, Beta Chain, Quant, S: 36380 m[IU]/mL — ABNORMAL HIGH (ref <5)

## 2023-05-12 NOTE — ED Triage Notes (Signed)
Patient arrives ambulatory by POV reports positive pregnancy test 2 days ago. States she is wondering how far along she is. Reports irregular periods and unsure of last cycle. Also has hypothyroidism and wanting to make sure her medications are safe to take. Patient sees [facility] for obgyn care.

## 2023-05-12 NOTE — Discharge Instructions (Signed)
It was a pleasure caring for you today. As discussed, you need to follow up with your GYN. Seek emergency care if experiencing any new or worsening symptoms

## 2023-05-12 NOTE — ED Provider Notes (Signed)
[place] EMERGENCY DEPARTMENT AT [facility] Provider Note   CSN: [id number] Arrival date & time: 05/12/23  1719     History  Chief Complaint  Patient presents with   Possible Pregnancy    Michele Grant is a 22 y.o. female with PMHx hypothyroidism who presents to ED concerned for pregnancy. Patient stating that she had a positive UPT at home. Patient stating that she does have some morning sickness but feels fine overall. Patient specifically concerned because she does not know if it is safe to take her levothyroxine while pregnant. Patient does have a GYN but has not met with them yet.  Denies fever, chest pain, dyspnea, cough, diarrhea, vaginal discharge/bleeding.   Possible Pregnancy       Home Medications Prior to Admission medications   Medication Sig Start Date End Date Taking? Authorizing Provider  levothyroxine (SYNTHROID) 125 MCG tablet Take 1 tablet (125 mcg total) by mouth daily before breakfast. 08/25/22   Shamleffer, Konrad Dolores, MD  norethindrone-ethinyl estradiol-FE (LOESTRIN FE) 1-20 MG-MCG tablet Take 1 tablet by mouth daily. Patient not taking: Reported on 08/24/2022 06/18/22 06/18/23  Corlis Hove, NP  Vitamin D, Ergocalciferol, (DRISDOL) 1.25 MG (50000 UNIT) CAPS capsule Take 1 capsule (50,000 Units total) by mouth every 7 (seven) days. 08/25/22   Shamleffer, Konrad Dolores, MD  VYVANSE 30 MG capsule Take 30 mg by mouth daily. 03/11/22   [provider]      Allergies    Patient has no known allergies.    Review of Systems   Review of Systems  Genitourinary:        Pregnant    Physical Exam Updated Vital Signs BP (!) 157/100   Pulse 75   Temp 98 F (36.7 C)   Resp 18   Ht 5\' 4"  (1.626 m)   Wt 95.3 kg   LMP 06/15/2022   SpO2 100%   BMI 36.05 kg/m  Physical Exam Vitals and nursing note reviewed.  Constitutional:      General: She is not in acute distress.    Appearance: She is not ill-appearing or toxic-appearing.   HENT:     Head: Normocephalic and atraumatic.     Mouth/Throat:     Mouth: Mucous membranes are moist.  Eyes:     General: No scleral icterus.       Right eye: No discharge.        Left eye: No discharge.     Conjunctiva/sclera: Conjunctivae normal.  Cardiovascular:     Rate and Rhythm: Normal rate and regular rhythm.     Pulses: Normal pulses.     Heart sounds: Normal heart sounds. No murmur heard. Pulmonary:     Effort: Pulmonary effort is normal. No respiratory distress.     Breath sounds: Normal breath sounds. No wheezing, rhonchi or rales.  Abdominal:     General: Abdomen is flat. Bowel sounds are normal. There is no distension.     Palpations: Abdomen is soft. There is no mass.     Tenderness: There is no abdominal tenderness.  Musculoskeletal:     Right lower leg: No edema.     Left lower leg: No edema.  Skin:    General: Skin is warm and dry.     Findings: No rash.  Neurological:     General: No focal deficit present.     Mental Status: She is alert. Mental status is at baseline.  Psychiatric:        Mood and Affect:  Mood normal.        Behavior: Behavior normal.     ED Results / Procedures / Treatments   Labs (all labs ordered are listed, but only abnormal results are displayed) Labs Reviewed  HCG, QUANTITATIVE, PREGNANCY    EKG None  Radiology No results found.  Procedures Procedures    Medications Ordered in ED Medications - No data to display  ED Course/ Medical Decision Making/ A&P                                 Medical Decision Making Amount and/or Complexity of Data Reviewed Labs: ordered.   This patient presents to the ED for concern of medication question and pregnancy, this involves an extensive number of treatment options, and is a complaint that carries with it a high risk of complications and morbidity.  The differential diagnosis includes ectopic pregnancy, miscarriage, medication ADR.   Co morbidities that complicate the  patient evaluation  hypothyroidism   Additional history obtained:  PCP Dr. Hosie Poisson   Problem List / ED Course / Critical interventions / Medication management  Patient presents to ED concerned for pregnancy and medication question. Positive UPT 2 days ago. Patient concerned because she does not know if levothyroxine is safe or not during pregnancy. Patient with GYN but has not reached out for an appointment yet. Patient has been having morning sickness but over all feels fine. Physical exam reassuring. Patient afebrile with stable vitals. No complaints of abdominal pain or vaginal discharge/bleeding. Educated patient that levothyroxine is safe in pregnancy and she should not stop taking this medication. Educated patient that she will need to follow up with GYN. Patient verbalized understanding of plan.  Shared decision making with patient who agreed to follow up with hcg quant result with GYN rather than waiting in ED.  I have reviewed the patients home medicines and have made adjustments as needed Patient afebrile with stable vitals. Provided with return precautions. Discharged in good condition.    Social Determinants of Health:  none         Final Clinical Impression(s) / ED Diagnoses Final diagnoses:  Less than [redacted] weeks gestation of pregnancy  Medication care plan discussed with patient    Rx / DC Orders ED Discharge Orders     None         Dorthy Cooler, [place] 05/12/23 1818    Charlynne Pander, MD 05/12/23 2013

## 2023-05-21 ENCOUNTER — Emergency Department (HOSPITAL_COMMUNITY)
Admission: EM | Admit: 2023-05-21 | Discharge: 2023-05-21 | Discharge status: Against Medical Advice | Payer: Medicaid Other | Attending: Emergency Medicine | Admitting: Emergency Medicine

## 2023-05-21 ENCOUNTER — Encounter (HOSPITAL_COMMUNITY): Payer: Self-pay | Admitting: Emergency Medicine

## 2023-05-21 ENCOUNTER — Other Ambulatory Visit: Payer: Self-pay

## 2023-05-21 DIAGNOSIS — O219 Vomiting of pregnancy, unspecified: Secondary | ICD-10-CM | POA: Diagnosis present

## 2023-05-21 DIAGNOSIS — R1030 Lower abdominal pain, unspecified: Secondary | ICD-10-CM | POA: Diagnosis not present

## 2023-05-21 DIAGNOSIS — Z3A01 Less than 8 weeks gestation of pregnancy: Secondary | ICD-10-CM | POA: Diagnosis not present

## 2023-05-21 DIAGNOSIS — Z20822 Contact with and (suspected) exposure to covid-19: Secondary | ICD-10-CM | POA: Diagnosis not present

## 2023-05-21 DIAGNOSIS — R067 Sneezing: Secondary | ICD-10-CM | POA: Diagnosis not present

## 2023-05-21 DIAGNOSIS — R509 Fever, unspecified: Secondary | ICD-10-CM | POA: Diagnosis not present

## 2023-05-21 DIAGNOSIS — O26891 Other specified pregnancy related conditions, first trimester: Secondary | ICD-10-CM | POA: Insufficient documentation

## 2023-05-21 DIAGNOSIS — R059 Cough, unspecified: Secondary | ICD-10-CM | POA: Insufficient documentation

## 2023-05-21 DIAGNOSIS — Z5321 Procedure and treatment not carried out due to patient leaving prior to being seen by health care provider: Secondary | ICD-10-CM | POA: Insufficient documentation

## 2023-05-21 LAB — COMPREHENSIVE METABOLIC PANEL
ALT: 21 U/L (ref 0–44)
AST: 17 U/L (ref 15–41)
Albumin: 4.5 g/dL (ref 3.5–5.0)
Alkaline Phosphatase: 56 U/L (ref 38–126)
Anion gap: 10 (ref 5–15)
BUN: 5 mg/dL — ABNORMAL LOW (ref 6–20)
CO2: 20 mmol/L — ABNORMAL LOW (ref 22–32)
Calcium: 9.6 mg/dL (ref 8.9–10.3)
Chloride: 103 mmol/L (ref 98–111)
Creatinine, Ser: 0.64 mg/dL (ref 0.44–1.00)
GFR, Estimated: >60 mL/min (ref >60)
Glucose, Bld: 92 mg/dL (ref 70–99)
Potassium: 3.4 mmol/L — ABNORMAL LOW (ref 3.5–5.1)
Sodium: 133 mmol/L — ABNORMAL LOW (ref 135–145)
Total Bilirubin: 1 mg/dL (ref <1.2)
Total Protein: 8.2 g/dL — ABNORMAL HIGH (ref 6.5–8.1)

## 2023-05-21 LAB — WET PREP, GENITAL
Clue Cells Wet Prep HPF POC: NONE SEEN
Sperm: NONE SEEN
Trich, Wet Prep: NONE SEEN
WBC, Wet Prep HPF POC: <10 (ref <10)
Yeast Wet Prep HPF POC: NONE SEEN

## 2023-05-21 LAB — CBC
HCT: 40.6 % (ref 36.0–46.0)
Hemoglobin: 14.4 g/dL (ref 12.0–15.0)
MCH: 33.3 pg (ref 26.0–34.0)
MCHC: 35.5 g/dL (ref 30.0–36.0)
MCV: 93.8 fL (ref 80.0–100.0)
Platelets: 297 10*3/uL (ref 150–400)
RBC: 4.33 MIL/uL (ref 3.87–5.11)
RDW: 12 % (ref 11.5–15.5)
WBC: 10.2 10*3/uL (ref 4.0–10.5)
nRBC: 0 % (ref 0.0–0.2)

## 2023-05-21 LAB — URINALYSIS, ROUTINE W REFLEX MICROSCOPIC
Glucose, UA: NEGATIVE mg/dL
Ketones, ur: 80 mg/dL — AB
Nitrite: NEGATIVE
Protein, ur: 30 mg/dL — AB
Specific Gravity, Urine: 1.024 (ref 1.005–1.030)
Squamous Epithelial / HPF: >50 /[HPF] (ref 0–5)
pH: 5 (ref 5.0–8.0)

## 2023-05-21 LAB — SARS CORONAVIRUS 2 BY RT PCR: SARS Coronavirus 2 by RT PCR: NEGATIVE

## 2023-05-21 LAB — HCG, QUANTITATIVE, PREGNANCY: hCG, Beta Chain, Quant, S: 71100 m[IU]/mL — ABNORMAL HIGH (ref <5)

## 2023-05-21 NOTE — ED Notes (Signed)
Pt left due to wait time  

## 2023-05-21 NOTE — ED Triage Notes (Signed)
Pt presents from home for increased nausea, emesis (unable to keep down any fluids), flu like sx  x5 days. Endorses lower abd cramping intermittently, yeast-like vaginal discharge and irritation, fever (80F at home), sneezing, dry cough. Denies bloody discharge  She is [redacted] weeks pregnant.  Afebrile in triage. Positive sick contacts at home.

## 2023-06-23 IMAGING — CR DG CHEST 2V
2 series · 2 of 2 positions shown · non-contrast
Comparison: Chest x-ray 08/14/2004.

CLINICAL DATA: 20-year-old female with history of cough and fever
for the past several days.

EXAM:
CHEST - 2 VIEW

[chest pa]
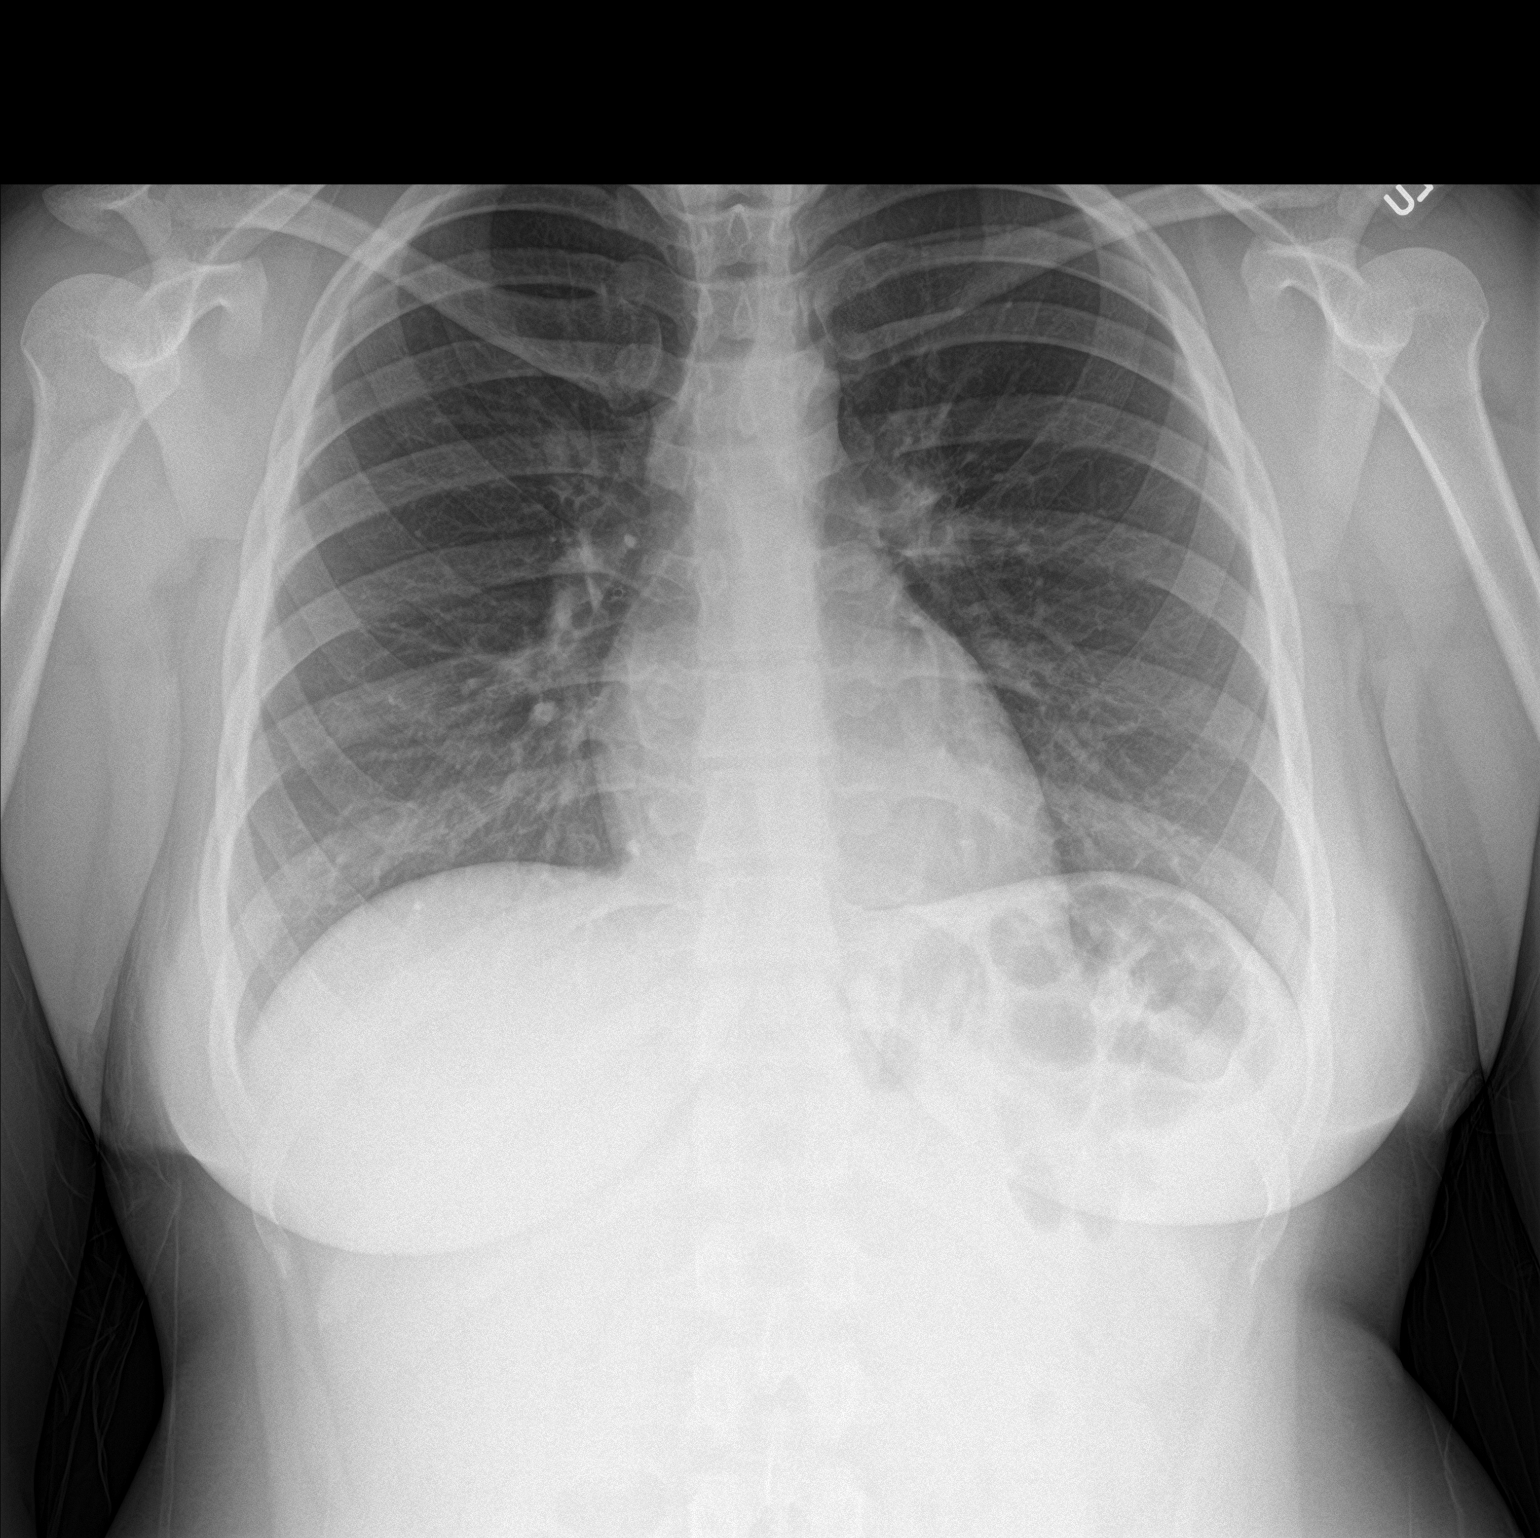

[chest lat]
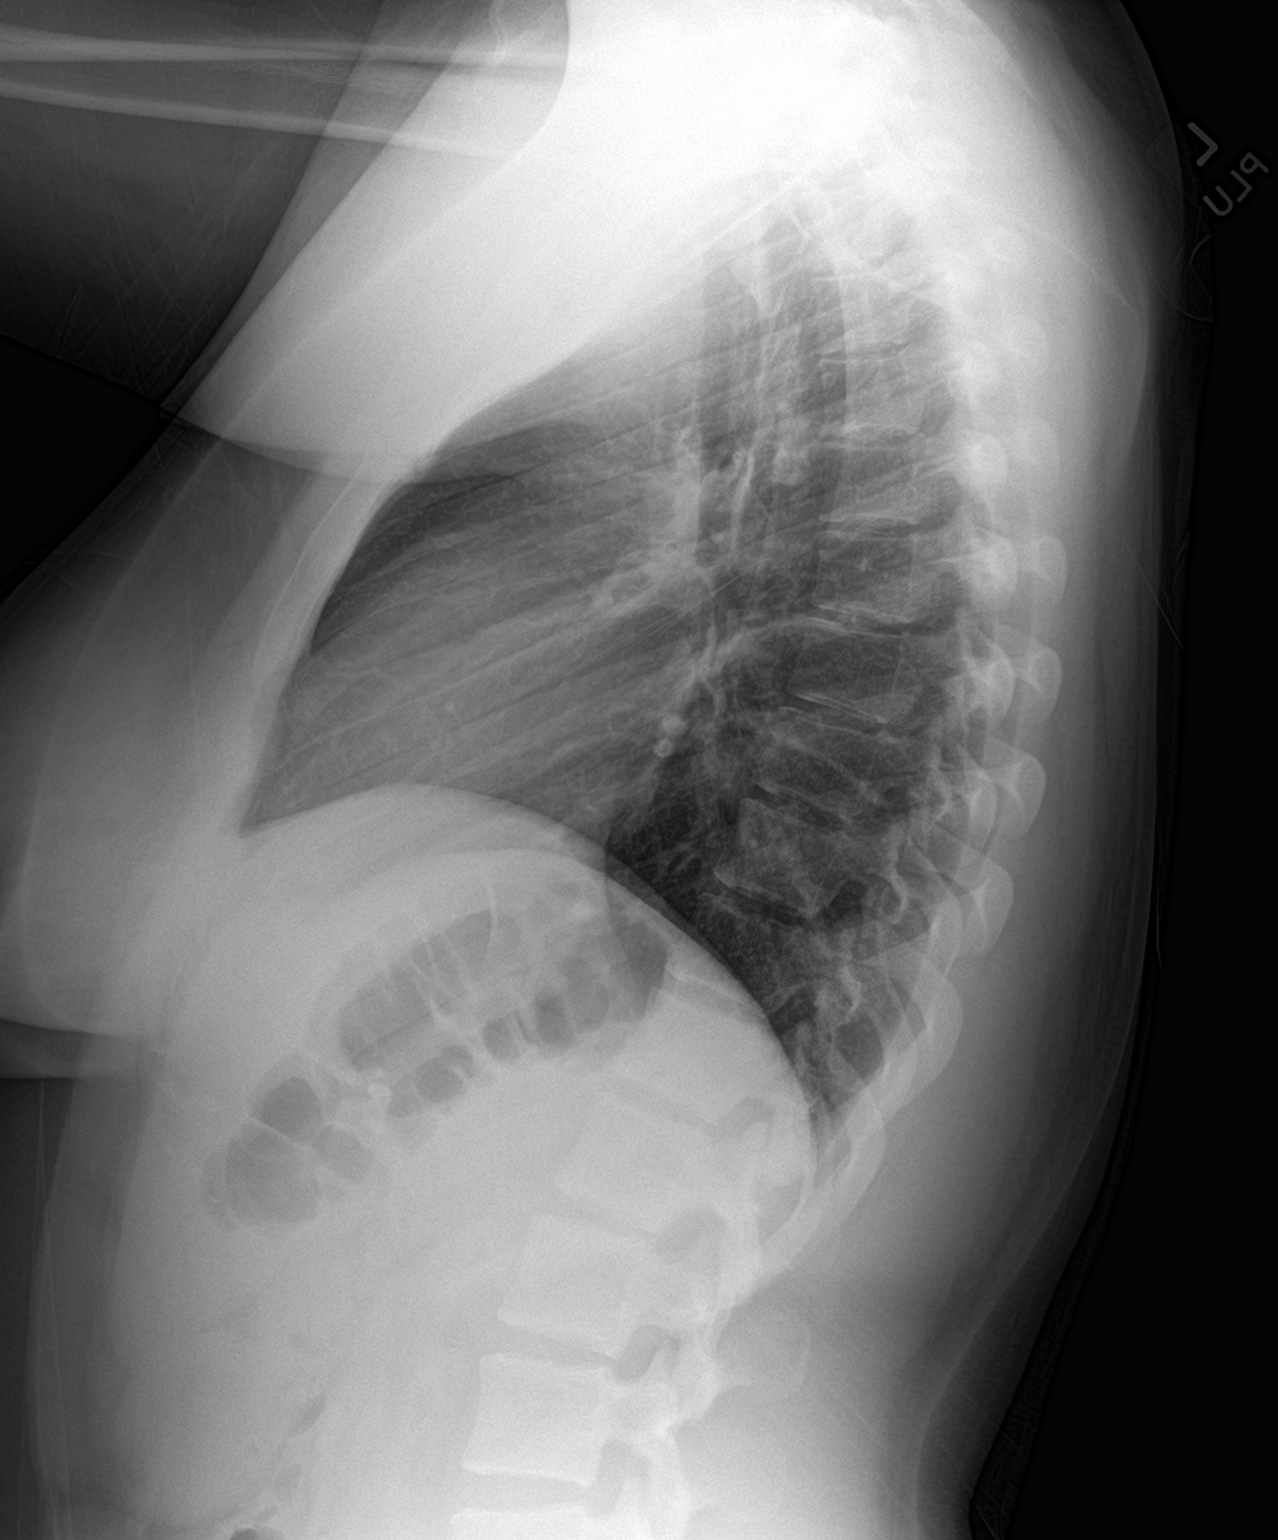

[2 of 2 positions shown; findings below may reference images not displayed]

FINDINGS: Lung volumes are normal. No consolidative airspace disease. No
pleural effusions. No pneumothorax. No pulmonary nodule or mass
noted. Pulmonary vasculature and the cardiomediastinal silhouette
are within normal limits.
IMPRESSION: No radiographic evidence of acute cardiopulmonary disease.

## 2023-07-22 ENCOUNTER — Ambulatory Visit: Payer: Medicaid Other | Admitting: Internal Medicine

## 2023-07-22 NOTE — Progress Notes (Deleted)
 Name: Michele Grant  MRN/ DOB: [id number], [birth year]    Age/ Sex: 23 y.o., female    PCP: Pcp, No   Reason for Endocrinology Evaluation: Hypothyroidism      Date of Initial Endocrinology Evaluation: 08/24/2022    HPI: Michele Grant is a 23 y.o. female with a past medical history of Hypothyroidism. The patient presented for initial endocrinology clinic visit on 08/24/2022 for consultative assistance with her Hypothyroidism.    Pt has been diagnosed with hypothyroidism 2023, she was started on Levothyroxine  at the time  She was having weight gain as well as hair loss and fatigue , she has not noted any improvement of her symptoms despite being on levothyroxine   TPO antibody elevated at 410 in 08/2022  Has noted hirsutism around the chin area  Denied acne   She has also been noted with elevated testosterone  06/2022 at 80 ng/dL  She had a normal pelvic ultrasound 02/2021 Tried OCP's but caused amenorrhea   She never submitted 24-hour urinary cortisol She did NOT meet criteria for PCOS   Maternal GM with thyroid  disease    SUBJECTIVE:    Today (07/22/23):  Michele Grant is here for follow-up on Hashimoto's thyroiditis.     HISTORY:  Past Medical History:  Past Medical History:  Diagnosis Date   Hypothyroidism    Past Surgical History: No past surgical history on file.  Social History:  reports that she has never smoked. She has been exposed to tobacco smoke. She has never used smokeless tobacco. She reports that she does not drink alcohol and does not use drugs. Family History: family history is not on file.   HOME MEDICATIONS: Allergies as of 07/22/2023   No Known Allergies      Medication List        Accurate as of July 22, 2023  7:11 AM. If you have any questions, ask your nurse or doctor.          levothyroxine  125 MCG tablet Commonly known as: Synthroid  Take 1 tablet (125 mcg total) by mouth daily before breakfast.   norethindrone-ethinyl  estradiol-FE 1-20 MG-MCG tablet Commonly known as: LOESTRIN FE Take 1 tablet by mouth daily.   Vitamin D  (Ergocalciferol ) 1.25 MG (50000 UNIT) Caps capsule Commonly known as: DRISDOL  Take 1 capsule (50,000 Units total) by mouth every 7 (seven) days.   Vyvanse 30 MG capsule Generic drug: lisdexamfetamine Take 30 mg by mouth daily.          REVIEW OF SYSTEMS: A comprehensive ROS was conducted with the patient and is negative except as per HPI     OBJECTIVE:  VS: LMP 03/17/2023 (Approximate)    Wt Readings from Last 3 Encounters:  05/21/23 210 lb (95.3 kg)  05/12/23 210 lb (95.3 kg) (98%, Z= 2.08)*  01/27/23 210 lb (95.3 kg) (98%, Z= 2.08)*   * Growth percentiles are based on CDC (Girls, 2-20 Years) data.     EXAM: General: Pt appears well and is in NAD  Eyes: External eye exam normal without stare, lid lag or exophthalmos.  EOM intact.  Neck: General: Supple without adenopathy. Thyroid : Thyroid  size normal.  No goiter or nodules appreciated.  Lungs: Clear with good BS bilat with no rales, rhonchi, or wheezes  Heart: Auscultation: RRR.  Abdomen: soft, nontender, multiple abdominal wall striae  Extremities:  BL LE: No pretibial edema normal ROM and strength.  Mental Status: Judgment, insight: Intact Orientation: Oriented to time, place, and person Mood and affect: No  depression, anxiety, or agitation     DATA REVIEWED:     Latest Reference Range & Units 08/24/22 13:52  Sodium 135 - 145 mEq/L 136  Potassium 3.5 - 5.1 mEq/L 4.1  Chloride 96 - 112 mEq/L 103  CO2 19 - 32 mEq/L 23  Glucose 70 - 99 mg/dL 94  BUN 6 - 23 mg/dL 10  Creatinine 9.59 - 8.79 mg/dL 9.17  Calcium 8.4 - 89.4 mg/dL 9.9  GFR >39.99 mL/min 103.81    Latest Reference Range & Units 08/24/22 13:52  VITD 30.00 - 100.00 ng/mL 16.43 (L)  Vitamin B12 211 - 911 pg/mL 223    Latest Reference Range & Units 08/24/22 13:52  TSH 0.40 - 5.00 uIU/mL 12.09 (H)  (H): Data is abnormally  high  ASSESSMENT/PLAN/RECOMMENDATIONS:   Hashimoto's thyroiditis:  -Patient with symptoms of weight gain, and fatigue -- Pt educated extensively on the correct way to take levothyroxine  (first thing in the morning with water, 30 minutes before eating or taking other medications). - Pt encouraged to double dose the following day if she were to miss a dose given long half-life of levothyroxine .   Medications : Levothyroxine  112 mcg daily    Follow-up in 6 months  Signed electronically by: Stefano Redgie Butts, MD  [facility] Endocrinology  [facility] [address] [place] [place], [place] [address] Phone: [phone] FAX: [phone]   CC: Pcp, No No address on file Phone: None Fax: None   Return to Endocrinology clinic as below: Future Appointments  Date Time Provider Department Center  07/22/2023  9:50 AM Nivea Wojdyla, Donell Redgie, MD [facility] None

## 2023-08-25 ENCOUNTER — Telehealth: Payer: Self-pay | Admitting: Internal Medicine

## 2023-08-25 NOTE — Telephone Encounter (Signed)
Patient called in to reschedule her follow up and also asked if she can get a refill on levothyroxine (SYNTHROID) 125 MCG tablet and have it sent to [id number] [place], [place] - [address]

## 2023-08-26 MED ORDER — LEVOTHYROXINE SODIUM 125 MCG PO TABS: 125.0000 ug | ORAL_TABLET | Freq: Every day | ORAL | 0 refills | Status: AC

## 2023-08-26 NOTE — Telephone Encounter (Signed)
Done

## 2023-09-13 ENCOUNTER — Ambulatory Visit: Payer: Medicaid Other | Admitting: Obstetrics and Gynecology

## 2023-09-20 DIAGNOSIS — Z3202 Encounter for pregnancy test, result negative: Secondary | ICD-10-CM | POA: Diagnosis not present

## 2023-09-20 DIAGNOSIS — M542 Cervicalgia: Secondary | ICD-10-CM | POA: Diagnosis not present

## 2023-09-20 DIAGNOSIS — R519 Headache, unspecified: Secondary | ICD-10-CM | POA: Diagnosis not present

## 2023-09-30 DIAGNOSIS — M542 Cervicalgia: Secondary | ICD-10-CM | POA: Diagnosis not present

## 2023-09-30 DIAGNOSIS — S060X0D Concussion without loss of consciousness, subsequent encounter: Secondary | ICD-10-CM | POA: Diagnosis not present

## 2023-10-13 ENCOUNTER — Ambulatory Visit: Payer: Medicaid Other | Admitting: Internal Medicine

## 2023-10-13 NOTE — Progress Notes (Deleted)
 Name: Michele Grant  MRN/ DOB: [id number], 05/24/2001    Age/ Sex: 23 y.o., female    PCP: Pcp, No   Reason for Endocrinology Evaluation: Hypothyroidism      Date of Initial Endocrinology Evaluation: 08/24/2022    HPI: [name] is a 23 y.o. female with a past medical history of Hypothyroidism. The patient presented for initial endocrinology clinic visit on 08/24/2022 for consultative assistance with her Hypothyroidism.     Pt has been diagnosed with hypothyroidism 2023, she was started on Levothyroxine at the time  She was having weight gain as well as hair loss and fatigue , she has not noted any improvement of her symptoms despite being on levothyroxine   She has also been noted with elevated testosterone 06/2022 at 80 ng/dL  She had a normal pelvic ultrasound 02/2021 Tried OCP's but caused amenorrhea     Patient has been noted with elevated TPO antibody at 410 international unit /mL 08/2022  Maternal GM with thyroid disease     SUBJECTIVE:    Today (10/13/23):  Michele Grant is here for follow-up on Hashimoto's thyroiditis.    HISTORY:  Past Medical History:  Past Medical History:  Diagnosis Date   Hypothyroidism    Past Surgical History: No past surgical history on file.  Social History:  reports that she has never smoked. She has been exposed to tobacco smoke. She has never used smokeless tobacco. She reports that she does not drink alcohol and does not use drugs. Family History: family history is not on file.   HOME MEDICATIONS: Allergies as of 10/13/2023   No Known Allergies      Medication List        Accurate as of October 13, 2023  9:40 AM. If you have any questions, ask your nurse or doctor.          levothyroxine 125 MCG tablet Commonly known as: Synthroid Take 1 tablet (125 mcg total) by mouth daily before breakfast.   norethindrone-ethinyl estradiol-FE 1-20 MG-MCG tablet Commonly known as: LOESTRIN FE Take 1 tablet by mouth daily.    Vitamin D (Ergocalciferol) 1.25 MG (50000 UNIT) Caps capsule Commonly known as: DRISDOL Take 1 capsule (50,000 Units total) by mouth every 7 (seven) days.   Vyvanse 30 MG capsule Generic drug: lisdexamfetamine Take 30 mg by mouth daily.          REVIEW OF SYSTEMS: A comprehensive ROS was conducted with the patient and is negative except as per HPI     OBJECTIVE:  VS: LMP 03/17/2023 (Approximate)    Wt Readings from Last 3 Encounters:  05/21/23 210 lb (95.3 kg)  05/12/23 210 lb (95.3 kg) (98%, Z= 2.08)*  01/27/23 210 lb (95.3 kg) (98%, Z= 2.08)*   * Growth percentiles are based on CDC (Girls, 2-20 Years) data.     EXAM: General: Pt appears well and is in NAD  Eyes: External eye exam normal without stare, lid lag or exophthalmos.  EOM intact.  Neck: General: Supple without adenopathy. Thyroid: Thyroid size normal.  No goiter or nodules appreciated.  Lungs: Clear with good BS bilat with no rales, rhonchi, or wheezes  Heart: Auscultation: RRR.  Abdomen: soft, nontender, multiple abdominal wall striae  Extremities:  BL LE: No pretibial edema normal ROM and strength.  Mental Status: Judgment, insight: Intact Orientation: Oriented to time, place, and person Mood and affect: No depression, anxiety, or agitation     DATA REVIEWED:  Latest Reference Range & Units  08/24/22 13:52  Thyroperoxidase Ab SerPl-aCnc <9 IU/mL 410 (H)  (H): Data is abnormally high   ASSESSMENT/PLAN/RECOMMENDATIONS:   Hashimoto's Thyroiditis :  -Patient with symptoms of weight gain, and fatigue -- Pt educated extensively on the correct way to take levothyroxine (first thing in the morning with water, 30 minutes before eating or taking other medications). - Pt encouraged to double dose the following day if she were to miss a dose given long half-life of levothyroxine.   Medications : Levothyroxine 112 mcg daily      Follow-up in 6 months  Signed electronically by: Lyndle Herrlich, MD  [facility] Endocrinology  [facility] [address] [place] [place], [place] [address] Phone: [phone] FAX: [phone]   CC: Pcp, No No address on file Phone: None Fax: None   Return to Endocrinology clinic as below: Future Appointments  Date Time Provider Department Center  10/13/2023  1:40 PM Blyss Lugar, Konrad Dolores, MD [facility] None

## 2023-10-26 DIAGNOSIS — H5213 Myopia, bilateral: Secondary | ICD-10-CM | POA: Diagnosis not present

## 2023-11-04 DIAGNOSIS — R519 Headache, unspecified: Secondary | ICD-10-CM | POA: Diagnosis not present

## 2023-11-04 DIAGNOSIS — M542 Cervicalgia: Secondary | ICD-10-CM | POA: Diagnosis not present

## 2023-11-21 ENCOUNTER — Other Ambulatory Visit: Payer: Self-pay | Admitting: Internal Medicine

## 2023-12-14 DIAGNOSIS — K5900 Constipation, unspecified: Secondary | ICD-10-CM | POA: Diagnosis not present

## 2023-12-14 DIAGNOSIS — Z3202 Encounter for pregnancy test, result negative: Secondary | ICD-10-CM | POA: Diagnosis not present

## 2023-12-14 DIAGNOSIS — S139XXA Sprain of joints and ligaments of unspecified parts of neck, initial encounter: Secondary | ICD-10-CM | POA: Diagnosis not present

## 2023-12-26 DIAGNOSIS — H52223 Regular astigmatism, bilateral: Secondary | ICD-10-CM | POA: Diagnosis not present

## 2024-03-14 ENCOUNTER — Telehealth: Payer: Self-pay | Admitting: Internal Medicine

## 2024-03-14 NOTE — Telephone Encounter (Signed)
 MEDICATION: Levothyroxine  125 MCG  [facility] [address] [facility] [place]  HAS THE PATIENT CONTACTED THEIR PHARMACY?  NO  IS THIS A 90 DAY SUPPLY : unknown  IS PATIENT OUT OF MEDICATION: yes  IF NOT; HOW MUCH IS LEFT:   LAST APPOINTMENT DATE: @02 /06/2023  NEXT APPOINTMENT DATE:@9 /19/2025  DO WE HAVE YOUR PERMISSION TO LEAVE A DETAILED MESSAGE?: yes  OTHER COMMENTS:    **Let patient know to contact pharmacy at the end of the day to make sure medication is ready. **  ** Please notify patient to allow 48-72 hours to process**  **Encourage patient to contact the pharmacy for refills or they can request refills through [facility]

## 2024-03-31 ENCOUNTER — Ambulatory Visit: Admitting: Internal Medicine

## 2024-03-31 ENCOUNTER — Encounter: Payer: Self-pay | Admitting: Internal Medicine

## 2024-03-31 NOTE — Progress Notes (Deleted)
 Name: Michele Grant  MRN/ DOB: [id number], 2000/11/06    Age/ Sex: 23 y.o., female    PCP: Pcp, No   Reason for Endocrinology Evaluation: Hypothyroidism      Date of Initial Endocrinology Evaluation: 08/24/2022    HPI: [name] is a 23 y.o. female with a past medical history of Hypothyroidism. The patient presented for initial endocrinology clinic visit on 08/24/2022 for consultative assistance with her Hypothyroidism.   Pt has been diagnosed with hypothyroidism 2023, she was started on Levothyroxine  at the time  TPO antibodies elevated at 410 international unit/ML   Follows with Gyn   Has noted hirsutism around the chin area  Denies acne   She has also been noted with elevated testosterone  06/2022 at 80 ng/dL  She had a normal pelvic ultrasound 02/2021 Tried OCP's but caused amenorrhea   Maternal GM with thyroid  disease    On her initial visit to our clinic she was complaining of weight gain and fatigue She did have some hirsutism around the chin area but no acne Vitamin D  was low at 16.43 and she was started on ergocalciferol    The patient was lost to follow-up 19 months after her initial visit to our clinic, until her return 03/2024   SUBJECTIVE:    Today (03/31/24): Michele Grant is here for follow-up on hypothyroidism and elevated testosterone .  The patient has NOT been to our clinic in 19 months.    HISTORY:  Past Medical History:  Past Medical History:  Diagnosis Date   Hypothyroidism    Past Surgical History: No past surgical history on file.  Social History:  reports that she has never smoked. She has been exposed to tobacco smoke. She has never used smokeless tobacco. She reports that she does not drink alcohol and does not use drugs. Family History: family history is not on file.   HOME MEDICATIONS: Allergies as of 03/31/2024   No Known Allergies      Medication List        Accurate as of March 31, 2024  6:54 AM. If you have any  questions, ask your nurse or doctor.          levothyroxine  125 MCG tablet Commonly known as: Synthroid  Take 1 tablet (125 mcg total) by mouth daily before breakfast.   norethindrone-ethinyl estradiol-FE 1-20 MG-MCG tablet Commonly known as: LOESTRIN FE Take 1 tablet by mouth daily.   Vitamin D  (Ergocalciferol ) 1.25 MG (50000 UNIT) Caps capsule Commonly known as: DRISDOL  Take 1 capsule (50,000 Units total) by mouth every 7 (seven) days.   Vyvanse 30 MG capsule Generic drug: lisdexamfetamine Take 30 mg by mouth daily.          REVIEW OF SYSTEMS: A comprehensive ROS was conducted with the patient and is negative except as per HPI     OBJECTIVE:  VS: There were no vitals taken for this visit.   Wt Readings from Last 3 Encounters:  05/21/23 210 lb (95.3 kg)  05/12/23 210 lb (95.3 kg) (98%, Z= 2.08)*  01/27/23 210 lb (95.3 kg) (98%, Z= 2.08)*   * Growth percentiles are based on CDC (Girls, 2-20 Years) data.     EXAM: General: Pt appears well and is in NAD  Eyes: External eye exam normal without stare, lid lag or exophthalmos.  EOM intact.  Neck: General: Supple without adenopathy. Thyroid : Thyroid  size normal.  No goiter or nodules appreciated.  Lungs: Clear with good BS bilat with no rales, rhonchi, or wheezes  Heart: Auscultation: RRR.  Abdomen: soft, nontender, multiple abdominal wall striae  Extremities:  BL LE: No pretibial edema normal ROM and strength.  Mental Status: Judgment, insight: Intact Orientation: Oriented to time, place, and person Mood and affect: No depression, anxiety, or agitation     DATA REVIEWED:     Latest Reference Range & Units 08/24/22 13:52  Sodium 135 - 145 mEq/L 136  Potassium 3.5 - 5.1 mEq/L 4.1  Chloride 96 - 112 mEq/L 103  CO2 19 - 32 mEq/L 23  Glucose 70 - 99 mg/dL 94  BUN 6 - 23 mg/dL 10  Creatinine 9.59 - 8.79 mg/dL 9.17  Calcium 8.4 - 89.4 mg/dL 9.9  GFR >39.99 mL/min 103.81    Latest Reference Range & Units  08/24/22 13:52  VITD 30.00 - 100.00 ng/mL 16.43 (L)  Vitamin B12 211 - 911 pg/mL 223    Latest Reference Range & Units 08/24/22 13:52  TSH 0.40 - 5.00 uIU/mL 12.09 (H)  (H): Data is abnormally high  ASSESSMENT/PLAN/RECOMMENDATIONS:   Hashimoto's thyroiditis  -Patient with symptoms of weight gain, and fatigue -- Pt educated extensively on the correct way to take levothyroxine  (first thing in the morning with water, 30 minutes before eating or taking other medications). - Pt encouraged to double dose the following day if she were to miss a dose given long half-life of levothyroxine .   Medications : Levothyroxine  112 mcg daily   2.  Elevated testosterone :  -Patient does NOT meet criteria for PCOS -She does have minimal hirsutism around the chin area, she tried OCPs through her GYN but did not like the way they made her feel -We discussed lifestyle changes and weight loss as a way to improve testosterone  level   3.  Vitamin D  deficiency/low vitamin B12:  -Will start ergocalciferol  50,000 IU weekly -Patient will be advised to start vitamin B12 1000 mcg daily    Follow-up in 6 months  Signed electronically by: Stefano Redgie Butts, MD  [facility] End[facility]  [facility] [address] [place] [place], [place] [address] Phone: [phone] FAX: [phone]   CC: Pcp, No No address on file Phone: None Fax: None   Return to Endocrinology clinic as below: Future Appointments  Date Time Provider Department Center  03/31/2024 11:50 AM Rockie Vawter, Donell Redgie, MD [id number] None
